# Patient Record
Sex: Male | Born: 1980 | Race: White | Hispanic: No | Marital: Single | Smoking: Current every day smoker
Health system: Southern US, Community
[De-identification: ages and names within clinical notes are randomized; demographics above are authoritative.]

---

## 2008-10-06 ENCOUNTER — Emergency Department: Payer: Self-pay | Admitting: Emergency Medicine

## 2009-08-28 ENCOUNTER — Emergency Department: Payer: Self-pay | Admitting: Emergency Medicine

## 2014-08-20 ENCOUNTER — Emergency Department: Payer: Self-pay | Admitting: Emergency Medicine

## 2015-11-07 SURGERY — LEFT HEART CATH
Anesthesia: Choice

## 2016-10-22 ENCOUNTER — Encounter: Payer: Self-pay | Admitting: Emergency Medicine

## 2016-10-22 ENCOUNTER — Emergency Department
Admission: EM | Admit: 2016-10-22 | Discharge: 2016-10-22 | Disposition: A | Discharge status: Home / Self Care | Payer: Self-pay | Attending: Emergency Medicine | Admitting: Emergency Medicine

## 2016-10-22 DIAGNOSIS — Y92009 Unspecified place in unspecified non-institutional (private) residence as the place of occurrence of the external cause: Secondary | ICD-10-CM | POA: Insufficient documentation

## 2016-10-22 DIAGNOSIS — F17200 Nicotine dependence, unspecified, uncomplicated: Secondary | ICD-10-CM | POA: Insufficient documentation

## 2016-10-22 DIAGNOSIS — T22211A Burn of second degree of right forearm, initial encounter: Secondary | ICD-10-CM | POA: Insufficient documentation

## 2016-10-22 DIAGNOSIS — Y93G3 Activity, cooking and baking: Secondary | ICD-10-CM | POA: Insufficient documentation

## 2016-10-22 DIAGNOSIS — Y999 Unspecified external cause status: Secondary | ICD-10-CM | POA: Insufficient documentation

## 2016-10-22 DIAGNOSIS — X101XXA Contact with hot food, initial encounter: Secondary | ICD-10-CM | POA: Insufficient documentation

## 2016-10-22 DIAGNOSIS — T22111A Burn of first degree of right forearm, initial encounter: Secondary | ICD-10-CM

## 2016-10-22 MED ORDER — HYDROCODONE-ACETAMINOPHEN 5-325 MG PO TABS: 1.0000 | ORAL_TABLET | ORAL | 0 refills | Status: DC | PRN

## 2016-10-22 MED ORDER — IBUPROFEN 600 MG PO TABS: 600.0000 mg | ORAL_TABLET | Freq: Three times a day (TID) | ORAL | 0 refills | Status: AC | PRN

## 2016-10-22 MED ORDER — IBUPROFEN 600 MG PO TABS
600.0000 mg | ORAL_TABLET | Freq: Once | ORAL | Status: AC
  Administered 2016-10-22: 600 mg via ORAL
  Filled 2016-10-22: qty 1

## 2016-10-22 MED ORDER — HYDROCODONE-ACETAMINOPHEN 5-325 MG PO TABS
1.0000 | ORAL_TABLET | Freq: Once | ORAL | Status: AC
  Administered 2016-10-22: 1 via ORAL
  Filled 2016-10-22: qty 1

## 2016-10-22 MED ORDER — SILVER SULFADIAZINE 1 % EX CREA: TOPICAL_CREAM | CUTANEOUS | 1 refills | Status: AC

## 2016-10-22 MED ORDER — SILVER SULFADIAZINE 1 % EX CREA
TOPICAL_CREAM | Freq: Every day | CUTANEOUS | Status: DC
  Administered 2016-10-22: 13:00:00 via TOPICAL
  Filled 2016-10-22: qty 85

## 2016-10-22 NOTE — ED Provider Notes (Signed)
[facility] Emergency Department Provider Note   ____________________________________________   First MD Initiated Contact with Patient 10/22/16 1259     (approximate)  I have reviewed the triage vital signs and the nursing notes.   HISTORY  Chief Complaint Burn   HPI Ryan Curtis is a 35 y.o. male is here with complaint of burn to his right forehand while he was cooking french fries in a "[employer]" at home. Patient states he spilled the grease on his right forearm. He has not taken any over-the-counter medication prior to his arrival. Patient states that his tetanus is up-to-date. Burn does not involve his hand. Currently he rates his pain as 10 over 10.    History reviewed. No pertinent past medical history.  There are no active problems to display for this patient.   History reviewed. No pertinent surgical history.  Prior to Admission medications   Medication Sig Start Date End Date Taking? Authorizing Provider  HYDROcodone-acetaminophen (NORCO/VICODIN) 5-325 MG tablet Take 1 tablet by mouth every 4 (four) hours as needed for moderate pain. 10/22/16   Tommi Rumpshonda L Laisha Rau, PA-C  ibuprofen (ADVIL,MOTRIN) 600 MG tablet Take 1 tablet (600 mg total) by mouth every 8 (eight) hours as needed. 10/22/16   Tommi Rumpshonda L Lyden Redner, PA-C  silver sulfADIAZINE (SILVADENE) 1 % cream Apply to affected area daily 10/22/16 10/22/17  Tommi Rumpshonda L Jaylyn Booher, PA-C    Allergies Penicillins  History reviewed. No pertinent family history.  Social History Social History  Substance Use Topics  . Smoking status: Current Every Day Smoker  . Smokeless tobacco: Never Used  . Alcohol use No    Review of Systems Constitutional: No fever/chills Eyes: No visual changes. Cardiovascular: Denies chest pain. Respiratory: Denies shortness of breath. Gastrointestinal:   No nausea, no vomiting.  Musculoskeletal: Negative for back pain. Skin: Grease burn to right forearm. Neurological:  Negative for headaches, focal weakness or numbness.  10-point ROS otherwise negative.  ____________________________________________   PHYSICAL EXAM:  VITAL SIGNS: ED Triage Vitals  Enc Vitals Group     BP 10/22/16 1252 140/75     Pulse Rate 10/22/16 1251 (!) 107     Resp 10/22/16 1251 18     Temp 10/22/16 1251 98.7 F (37.1 C)     Temp Source 10/22/16 1251 Oral     SpO2 10/22/16 1251 98 %     Weight 10/22/16 1251 215 lb (97.5 kg)     Height 10/22/16 1251 5\' 6"  (1.676 m)     Head Circumference --      Peak Flow --      Pain Score 10/22/16 1251 10     Pain Loc --      Pain Edu? --      Excl. in GC? --     Constitutional: Alert and oriented. Well appearing and in no acute distress. Eyes: Conjunctivae are normal. PERRL. EOMI. Head: Atraumatic. Nose: No congestion/rhinnorhea. Neck: No stridor.   Cardiovascular: Normal rate, regular rhythm. Grossly normal heart sounds.  Good peripheral circulation. Respiratory: Normal respiratory effort.  No retractions. Lungs CTAB. Musculoskeletal:Moves upper and lower extremities without any difficulty. Patient had normal gait to the exam room and was able to move without assistance. Neurologic:  Normal speech and language. No gross focal neurologic deficits are appreciated. No gait instability. Skin:  Skin is warm, dry. Dorsum of the right forearm there is direct first shaped burn areas consistent with a splash type pattern. Area is not circumferential. There is a  combination of first and second-degree burns present. Some open blisters are present. There is no involvement of the hand both dorsum and volar surface. Psychiatric: Mood and affect are normal. Speech and behavior are normal.  ____________________________________________   LABS (all labs ordered are listed, but only abnormal results are displayed)  Labs Reviewed - No data to display  PROCEDURES  Procedure(s) performed: None  Procedures  Critical Care performed:  No  ____________________________________________   INITIAL IMPRESSION / ASSESSMENT AND PLAN / ED COURSE  Pertinent labs & imaging results that were available during my care of the patient were reviewed by me and considered in my medical decision making (see chart for details).    Clinical Course    Patient was given ibuprofen and Norco while in the emergency room for pain. Area was cleaned with normal saline and a Silvadene burn dressing was applied. Patient was also given a prescription for Silvadene cream. He is to return [facility] the emergency room or [facility] in 2 days for recheck of his burn. He may return to the emergency room sooner if any signs of infection. Patient was discharged also with prescription for ibuprofen and for Norco.  ____________________________________________   FINAL CLINICAL IMPRESSION(S) / ED DIAGNOSES  Final diagnoses:  Burn of second degree of right forearm, initial encounter  Burn of first degree of right forearm, initial encounter      NEW MEDICATIONS STARTED DURING THIS VISIT:  Discharge Medication List as of 10/22/2016  1:42 PM    START taking these medications   Details  HYDROcodone-acetaminophen (NORCO/VICODIN) 5-325 MG tablet Take 1 tablet by mouth every 4 (four) hours as needed for moderate pain., Starting Tue 10/22/2016, Print    ibuprofen (ADVIL,MOTRIN) 600 MG tablet Take 1 tablet (600 mg total) by mouth every 8 (eight) hours as needed., Starting Tue 10/22/2016, Print         Note:  This document was prepared using Dragon voice recognition software and may include unintentional dictation errors.    Tommi RumpsRhonda L Elden Brucato, PA-C 10/22/16 1528    Jennye MoccasinBrian S Quigley, MD 10/22/16 1540

## 2016-10-22 NOTE — ED Triage Notes (Signed)
Pt spilled grease on right forearm today cooking french fries at home. First and mild second degree burn. Not circumferential. Does not involve hand.

## 2016-10-22 NOTE — ED Notes (Signed)
See triage note  First degree burns noted to right forearm

## 2016-10-22 NOTE — Discharge Instructions (Signed)
Take ibuprofen 600 mg 3 times a day with food. Norco as needed for severe pain. Do not drive while taking pain medication. Keep dressing clean and dry. You may need to elevate your arm to prevent swelling. Follow-up with urgent care or the emergency department for recheck after 1 minute at least 2 days and sooner if any signs of infection such as fever, increased pain, or pus.

## 2018-01-09 ENCOUNTER — Emergency Department
Admission: EM | Admit: 2018-01-09 | Discharge: 2018-01-09 | Disposition: A | Discharge status: Home / Self Care | Payer: Self-pay | Attending: Emergency Medicine | Admitting: Emergency Medicine

## 2018-01-09 ENCOUNTER — Other Ambulatory Visit: Payer: Self-pay

## 2018-01-09 DIAGNOSIS — K0889 Other specified disorders of teeth and supporting structures: Secondary | ICD-10-CM | POA: Insufficient documentation

## 2018-01-09 DIAGNOSIS — F17200 Nicotine dependence, unspecified, uncomplicated: Secondary | ICD-10-CM | POA: Insufficient documentation

## 2018-01-09 MED ORDER — HYDROCODONE-ACETAMINOPHEN 5-325 MG PO TABS: 1.0000 | ORAL_TABLET | Freq: Four times a day (QID) | ORAL | 0 refills | Status: AC | PRN

## 2018-01-09 MED ORDER — HYDROCODONE-ACETAMINOPHEN 5-325 MG PO TABS
1.0000 | ORAL_TABLET | Freq: Once | ORAL | Status: AC
  Administered 2018-01-09: 1 via ORAL
  Filled 2018-01-09: qty 1

## 2018-01-09 NOTE — Discharge Instructions (Signed)
OPTIONS FOR DENTAL FOLLOW UP CARE ° °[place] [facility] Health and Human Services - Local Safety Net Dental Clinics °http://www.ncdhhs.gov/dph/oralhealth/services/safetynetclinics.htm °  °[facility] (336-562-3123) ° °[facility] [place] ([phone]) ° °[facility] ([phone] ext [phone]) ° °[facility] ([phone]) ° °[facility] ([phone]) °This clinic caters to the indigent population and is on a lottery system. °Location: °[facility], [address], [place] °Clinic Hours: °Wednesdays from 6pm - 9pm, patients seen by a lottery system. °For dates, call or go to [email or web address].[email or web address]/[facility]/patients/Dental-[facility] °Services: °Cleanings, fillings and simple extractions. °Payment Options: °DENTAL WORK IS FREE OF CHARGE. Bring proof of income or support. °Best way to get seen: °Arrive at 5:15 pm - this is a lottery, NOT first come/first serve, so arriving earlier will not increase your chances of being seen. °  °  °[facility] °[phone] °Select option 1 for emergencies °  °Location: °[facility], [address], [place] °Clinic Hours: °No walk-ins accepted - call the day before to schedule an appointment. °Check in times are 9:30 am and 1:30 pm. °Services: °Simple extractions, temporary fillings, pulpectomy/pulp debridement, uncomplicated abscess drainage. °Payment Options: °PAYMENT IS DUE AT THE TIME OF SERVICE.  Fee is usually $100-200, additional surgical procedures (e.g. abscess drainage) may be extra. °Cash, checks, Visa/MasterCard accepted.  Can file Medicaid if patient is covered for dental - patient should call case worker to check. °No discount for [facility] patients. °Best way to get seen: °MUST call the day before and get onto the schedule. Can usually be seen the next 1-2 days. No walk-ins accepted. °  °  °[facility] °[phone] °   °Location: °[facility], [address], [place] °Clinic Hours: °M, W, Th, F 8am or 1:30pm, Tues 9a or 1:30 - first come/first served. °Services: °Simple extractions, temporary fillings, uncomplicated abscess drainage.  You do not need to be an [place] resident. °Payment Options: °PAYMENT IS DUE AT THE TIME OF SERVICE. °Dental insurance, otherwise sliding scale - bring proof of income or support. °Depending on income and treatment needed, cost is usually $50-200. °Best way to get seen: °Arrive early as it is first come/first served. °  °  °[facility] °[phone] °  °Location: °[address] °Clinic Hours: °Mon-Thu 8a-5p °Services: °Most basic dental services including extractions and fillings. °Payment Options: °PAYMENT IS DUE AT THE TIME OF SERVICE. °Sliding scale, up to 50% off - bring proof if income or support. °Medicaid with dental option accepted. °Best way to get seen: °Call to schedule an appointment, can usually be seen within 2 weeks OR they will try to see walk-ins - show up at 8a or 2p (you may have to wait). °  °  °[facility] °[phone] °[place] RESIDENTS ONLY °  °Location: °[facility], [address], [place], [place] [address] °Clinic Hours: By appointment only. °Monday - Thursday 8am-5pm, Friday 8am-12pm °Services: Cleanings, fillings, extractions. °Payment Options: °PAYMENT IS DUE AT THE TIME OF SERVICE. °Cash, Visa or MasterCard. Sliding scale - $30 minimum per service. °Best way to get seen: °Come in to office, complete packet and make an appointment - need proof of income °or support monies for each household member and proof of [place] residence. °Usually takes about a month to get in. °  °  °[facility] °[phone] °  °Location: °[address]   [place] °Clinic Hours: Walk-in Urgent Care Dental Services are offered Monday-Friday  mornings only. °The numbers of emergencies accepted daily is limited to the number of °providers available. °Maximum 15 - Mondays, Wednesdays & Thursdays °Maximum 10 - Tuesdays & Fridays °Services: °You do not need to be a [place] resident to be seen for a dental emergency. °Emergencies are defined as pain, swelling, abnormal bleeding, or dental trauma. Walkins will receive x-rays if needed. °NOTE: Dental cleaning is not an emergency. °Payment Options: °PAYMENT IS DUE AT THE TIME OF SERVICE. °Minimum co-pay is $40.00 for uninsured patients. °Minimum co-pay is $3.00 for Medicaid with dental coverage. °Dental Insurance is accepted and must be presented at time of visit. °Medicare does not cover dental. °Forms of payment: Cash, credit card, checks. °Best way to get seen: °If not previously registered with the clinic, walk-in dental registration begins at 7:15 am and is on a first come/first serve basis. °If previously registered with the clinic, call to make an appointment. °  °  °[facility] °[phone] °[place] RESIDENTS ONLY °  °Location: °[address], [place], [place] °Clinic Hours: °Mon-Thu 10a-2p °Services: Extractions only! °Payment Options: °FREE (donations accepted) - bring proof of income or support °Best way to get seen: °Call and schedule an appointment OR come at 8am on the 1st Monday of every month (except for holidays) when it is first come/first served. °  °  °[facility] °[phone] °  °Location: °[address], [place] °Clinic Hours: °Friday mornings °Services, Payment Options, Best way to get seen: °Call for info °

## 2018-01-09 NOTE — ED Triage Notes (Signed)
Pt comes from home with c/o dental pain. Pt states he is having left sided tooth pain. Pt states he has taken medication and used orajel but no relief.

## 2018-01-09 NOTE — ED Notes (Signed)
Pt c/o left sided dental pain x2 days; unrelieved by medications at home such as Orajel and advil.

## 2018-01-09 NOTE — ED Provider Notes (Signed)
[facility] Emergency Department Provider Note  ____________________________________________  Time seen: Approximately 3:42 PM  I have reviewed the triage vital signs and the nursing notes.   HISTORY  Chief Complaint Dental Pain    HPI Ryan Curtis is a 37 y.o. male presenting to the emergency department with inferior 19 pain dental caries.  Patient reports that his dentist is not in the office today and will be back on Monday.  The patient rates his pain at 10 out of 10 in intensity and reports that pain is influencing activities of daily living.  He denies fever, chills, nausea and vomiting.   History reviewed. No pertinent past medical history.  There are no active problems to display for this patient.   History reviewed. No pertinent surgical history.  Prior to Admission medications   Medication Sig Start Date End Date Taking? Authorizing Provider  HYDROcodone-acetaminophen (NORCO) 5-325 MG tablet Take 1 tablet by mouth every 6 (six) hours as needed for up to 2 days for moderate pain. 01/09/18 01/11/18  Orvil Feil, PA-C  ibuprofen (ADVIL,MOTRIN) 600 MG tablet Take 1 tablet (600 mg total) by mouth every 8 (eight) hours as needed. 10/22/16   Tommi Rumps, PA-C    Allergies Penicillins  No family history on file.  Social History Social History   Tobacco Use  . Smoking status: Current Every Day Smoker  . Smokeless tobacco: Never Used  Substance Use Topics  . Alcohol use: No  . Drug use: No     Review of Systems  Constitutional: No fever/chills Eyes: No visual changes. No discharge ENT: Patient has dental pain.  Cardiovascular: no chest pain. Respiratory: no cough. No SOB. Musculoskeletal: Negative for musculoskeletal pain. Skin: Negative for rash, abrasions, lacerations, ecchymosis.  ____________________________________________   PHYSICAL EXAM:  VITAL SIGNS: ED Triage Vitals  Enc Vitals Group     BP 01/09/18 1346 (!)  159/91     Pulse Rate 01/09/18 1346 82     Resp 01/09/18 1346 18     Temp 01/09/18 1346 98.6 F (37 C)     Temp Source 01/09/18 1346 Oral     SpO2 01/09/18 1346 99 %     Weight 01/09/18 1347 208 lb (94.3 kg)     Height 01/09/18 1347 5\' 8"  (1.727 m)     Head Circumference --      Peak Flow --      Pain Score 01/09/18 1347 10     Pain Loc --      Pain Edu? --      Excl. in GC? --      Constitutional: Alert and oriented. Well appearing and in no acute distress. Eyes: Conjunctivae are normal. PERRL. EOMI. Head: Atraumatic. ENT:      Ears: TMs are pearly.       Nose: No congestion/rhinnorhea.      Mouth/Throat: Mucous membranes are moist.  Patient has dental caries of inferior 19. Neck: No stridor.  No cervical spine tenderness to palpation. Cardiovascular: Normal rate, regular rhythm. Normal S1 and S2.  Good peripheral circulation. Respiratory: Normal respiratory effort without tachypnea or retractions. Lungs CTAB. Good air entry to the bases with no decreased or absent breath sounds. Skin:  Skin is warm, dry and intact. No rash noted. Psychiatric: Mood and affect are normal. Speech and behavior are normal. Patient exhibits appropriate insight and judgement.   ____________________________________________   LABS (all labs ordered are listed, but only abnormal results are displayed)  Labs  Reviewed - No data to display ____________________________________________  EKG   ____________________________________________  RADIOLOGY   No results found.  ____________________________________________    PROCEDURES  Procedure(s) performed:    Procedures    Medications  HYDROcodone-acetaminophen (NORCO/VICODIN) 5-325 MG per tablet 1 tablet (not administered)     ____________________________________________   INITIAL IMPRESSION / ASSESSMENT AND PLAN / ED COURSE  Pertinent labs & imaging results that were available during my care of the patient were reviewed by me  and considered in my medical decision making (see chart for details).  Review of the  [place]RS was performed in accordance of the [facility] prior to dispensing any controlled drugs.  Assessment and plan Dental pain Patient presents to the emergency department with dental pain of the inferior 19.  Due to severity of pain and patient's [place], patient was given Norco in the emergency department and discharged with a 2-day course of Norco to get him through the weekend.  Patient was advised to make an appointment with a local dentist as soon as possible.    ____________________________________________  FINAL CLINICAL IMPRESSION(S) / ED DIAGNOSES  Final diagnoses:  Pain, dental      NEW MEDICATIONS STARTED DURING THIS VISIT:  ED Discharge Orders        Ordered    HYDROcodone-acetaminophen (NORCO) 5-325 MG tablet  Every 6 hours PRN     01/09/18 1537          This chart was dictated using voice recognition software/Dragon. Despite best efforts to proofread, errors can occur which can change the meaning. Any change was purely unintentional.    Orvil FeilWoods, Lenzie Montesano M, PA-C 01/09/18 1550    Minna AntisPaduchowski, Kevin, MD 01/09/18 2206

## 2018-05-13 ENCOUNTER — Emergency Department
Admission: EM | Admit: 2018-05-13 | Discharge: 2018-05-13 | Disposition: A | Discharge status: Home / Self Care | Payer: Self-pay | Attending: Emergency Medicine | Admitting: Emergency Medicine

## 2018-05-13 ENCOUNTER — Emergency Department: Payer: Self-pay

## 2018-05-13 ENCOUNTER — Encounter: Payer: Self-pay | Admitting: Emergency Medicine

## 2018-05-13 DIAGNOSIS — S2232XA Fracture of one rib, left side, initial encounter for closed fracture: Secondary | ICD-10-CM | POA: Insufficient documentation

## 2018-05-13 DIAGNOSIS — Y9389 Activity, other specified: Secondary | ICD-10-CM | POA: Insufficient documentation

## 2018-05-13 DIAGNOSIS — F17200 Nicotine dependence, unspecified, uncomplicated: Secondary | ICD-10-CM | POA: Insufficient documentation

## 2018-05-13 DIAGNOSIS — W19XXXA Unspecified fall, initial encounter: Secondary | ICD-10-CM

## 2018-05-13 DIAGNOSIS — Y92009 Unspecified place in unspecified non-institutional (private) residence as the place of occurrence of the external cause: Secondary | ICD-10-CM | POA: Insufficient documentation

## 2018-05-13 DIAGNOSIS — Y999 Unspecified external cause status: Secondary | ICD-10-CM | POA: Insufficient documentation

## 2018-05-13 DIAGNOSIS — W1789XA Other fall from one level to another, initial encounter: Secondary | ICD-10-CM | POA: Insufficient documentation

## 2018-05-13 DIAGNOSIS — R0781 Pleurodynia: Secondary | ICD-10-CM

## 2018-05-13 MED ORDER — MELOXICAM 15 MG PO TABS: 15.0000 mg | ORAL_TABLET | Freq: Every day | ORAL | 1 refills | Status: AC

## 2018-05-13 MED ORDER — OXYCODONE-ACETAMINOPHEN 5-325 MG PO TABS
1.0000 | ORAL_TABLET | Freq: Once | ORAL | Status: AC
  Administered 2018-05-13: 1 via ORAL
  Filled 2018-05-13: qty 1

## 2018-05-13 MED ORDER — OXYCODONE-ACETAMINOPHEN 5-325 MG PO TABS: 1.0000 | ORAL_TABLET | Freq: Three times a day (TID) | ORAL | 0 refills | Status: AC | PRN

## 2018-05-13 MED ORDER — KETOROLAC TROMETHAMINE 30 MG/ML IJ SOLN
30.0000 mg | Freq: Once | INTRAMUSCULAR | Status: AC
  Administered 2018-05-13: 30 mg via INTRAMUSCULAR
  Filled 2018-05-13: qty 1

## 2018-05-13 NOTE — ED Notes (Signed)
Pt c/o left sided back pain after falling 684ft off a ladder. Pt states he was trying to get a ball from the gutter an slipped off the ladder. Pt denies hitting his head or any LOC.

## 2018-05-13 NOTE — ED Notes (Signed)

## 2018-05-13 NOTE — ED Notes (Signed)
Patient transported to XR. 

## 2018-05-13 NOTE — ED Provider Notes (Signed)
[facility] Emergency Department Provider Note  ____________________________________________  Time seen: Approximately 6:38 PM  I have reviewed the triage vital signs and the nursing notes.   HISTORY  Chief Complaint Fall    HPI Ryan Curtis is a 37 y.o. male presents to the emergency department after falling approximately 4 to 5 feet while trying to get a toy ball from the roof of the house.  Patient is reporting upper and lower back pain.  He currently rates his pain at 10 out of 10 in intensity.  Patient has had some mild tingling of the bilateral feet since incident occurred.  Patient did not hit his head or lose consciousness.  He is denying weakness and has been able to ambulate.  He denies shortness of breath, chest tightness and chest pain.   History reviewed. No pertinent past medical history.  There are no active problems to display for this patient.   History reviewed. No pertinent surgical history.  Prior to Admission medications   Medication Sig Start Date End Date Taking? Authorizing Provider  ibuprofen (ADVIL,MOTRIN) 600 MG tablet Take 1 tablet (600 mg total) by mouth every 8 (eight) hours as needed. 10/22/16   Tommi Rumps, PA-C  meloxicam (MOBIC) 15 MG tablet Take 1 tablet (15 mg total) by mouth daily for 7 days. 05/13/18 05/20/18  Orvil Feil, PA-C  oxyCODONE-acetaminophen (PERCOCET/ROXICET) 5-325 MG tablet Take 1 tablet by mouth every 8 (eight) hours as needed for up to 3 days for severe pain. 05/13/18 05/16/18  Orvil Feil, PA-C    Allergies Penicillins  No family history on file.  Social History Social History   Tobacco Use  . Smoking status: Current Every Day Smoker  . Smokeless tobacco: Never Used  Substance Use Topics  . Alcohol use: No  . Drug use: No     Review of Systems  Constitutional: No fever/chills Eyes: No visual changes. No discharge ENT: No upper respiratory complaints. Cardiovascular: no chest  pain. Respiratory: no cough. No SOB. Gastrointestinal: No abdominal pain.  No nausea, no vomiting.  No diarrhea.  No constipation. Genitourinary: Negative for dysuria. No hematuria Musculoskeletal: Patient has thoracic and lumbar back pain.  Skin: Negative for rash, abrasions, lacerations, ecchymosis. Neurological: Negative for headaches, focal weakness or numbness.  ____________________________________________   PHYSICAL EXAM:  VITAL SIGNS: ED Triage Vitals [05/13/18 1757]  Enc Vitals Group     BP (!) 157/81     Pulse Rate (!) 109     Resp 10     Temp 98.1 F (36.7 C)     Temp Source Oral     SpO2 100 %     Weight 195 lb (88.5 kg)     Height 5\' 3"  (1.6 m)     Head Circumference      Peak Flow      Pain Score 10     Pain Loc      Pain Edu?      Excl. in GC?      Constitutional: Alert and oriented. Well appearing and in no acute distress. Eyes: Conjunctivae are normal. PERRL. EOMI. Head: Atraumatic.  No palpable focal edema of the scalp. ENT:      Ears: TMs are pearly.  No discharge from the bilateral ears.  No ecchymosis was visualized behind the pinna bilaterally.      Nose: No congestion/rhinnorhea.      Mouth/Throat: Mucous membranes are moist.  Neck: No stridor.  No cervical spine tenderness  to palpation. Cardiovascular: Normal rate, regular rhythm. Normal S1 and S2.  Good peripheral circulation. Respiratory: Normal respiratory effort without tachypnea or retractions. Lungs CTAB. Good air entry to the bases with no decreased or absent breath sounds. Gastrointestinal: Bowel sounds 4 quadrants. Soft and nontender to palpation. No guarding or rigidity. No palpable masses. No distention. No CVA tenderness. Musculoskeletal: Full range of motion to all extremities. No gross deformities appreciated.  Patient has tenderness along the midline thoracic and lumbar spine.  Negative straight leg raise bilaterally. Neurologic:  Normal speech and language. No gross focal  neurologic deficits are appreciated.  Skin:  Skin is warm, dry and intact. No rash noted. Psychiatric: Mood and affect are normal. Speech and behavior are normal. Patient exhibits appropriate insight and judgement.   ____________________________________________   LABS (all labs ordered are listed, but only abnormal results are displayed)  Labs Reviewed - No data to display ____________________________________________  EKG   ____________________________________________  RADIOLOGY I personally viewed and evaluated these images as part of my medical decision making, as well as reviewing the written report by the radiologist.  Dg Chest 2 View  Result Date: 05/13/2018 CLINICAL DATA:  Mechanical fall, struck back. EXAM: CHEST - 2 VIEW COMPARISON:  None. FINDINGS: Cardiomediastinal silhouette is normal. No pleural effusions or focal consolidations. Trachea projects midline and there is no pneumothorax. Soft tissue planes and included osseous structures are non-suspicious. IMPRESSION: Normal. Electronically Signed   By: Awilda Metroourtnay  Bloomer M.D.   On: 05/13/2018 18:24   Dg Ribs Unilateral Left  Result Date: 05/13/2018 CLINICAL DATA:  Fall off ladder from 4 ft height. Left rib pain. Initial encounter. EXAM: LEFT RIBS - 2 VIEW COMPARISON:  None. FINDINGS: Probable mildly displaced fracture of the posteromedial left 12th rib. No other rib fractures or bone lesions identified. IMPRESSION: Probable mildly displaced fracture of the left posteromedial 12th rib. Electronically Signed   By: Myles RosenthalJohn  Stahl M.D.   On: 05/13/2018 19:52   Dg Thoracic Spine 2 View  Result Date: 05/13/2018 CLINICAL DATA:  Fall from ladder.  Pain in left lower ribs. EXAM: THORACIC SPINE 2 VIEWS COMPARISON:  05/13/2018 FINDINGS: Mild curvature of the thoracic spine is convex towards the right. The vertebral body heights are well preserved. Normal appearance of the disc spaces. No fractures or dislocations. IMPRESSION: 1. No acute  findings. 2. Mild thoracic scoliosis. Electronically Signed   By: Signa Kellaylor  Stroud M.D.   On: 05/13/2018 19:48   Dg Lumbar Spine Complete  Result Date: 05/13/2018 CLINICAL DATA:  Fall off ladder. EXAM: LUMBAR SPINE - COMPLETE 4+ VIEW COMPARISON:  None. FINDINGS: There is a anterolisthesis of L5 on S1 measuring 1.4 cm. Bilateral L5 pars defects are identified. The vertebral body heights are well preserved. No compression fractures identified. IMPRESSION: 1. No acute compression deformities. 2. 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 Electronically Signed   By: Signa Kellaylor  Stroud M.D.   On: 05/13/2018 19:55   Dg Clavicle Left  Result Date: 05/13/2018 CLINICAL DATA:  Fall off ladder from 4 ft height. Left clavicle pain. Initial encounter. EXAM: LEFT CLAVICLE - 2+ VIEWS COMPARISON:  None. FINDINGS: There is no evidence of fracture or other focal bone lesions. Soft tissues are unremarkable. IMPRESSION: Negative. Electronically Signed   By: Myles RosenthalJohn  Stahl M.D.   On: 05/13/2018 19:51    ____________________________________________    PROCEDURES  Procedure(s) performed:    Procedures    Medications  ketorolac (TORADOL) 30 MG/ML injection 30 mg (30 mg Intramuscular  Given 05/13/18 1840)  oxyCODONE-acetaminophen (PERCOCET/ROXICET) 5-325 MG per tablet 1 tablet (1 tablet Oral Given 05/13/18 1840)     ____________________________________________   INITIAL IMPRESSION / ASSESSMENT AND PLAN / ED COURSE  Pertinent labs & imaging results that were available during my care of the patient were reviewed by me and considered in my medical decision making (see chart for details).  Review of the [place] CSRS was performed in accordance of the [facility] prior to dispensing any controlled drugs.      Assessment and Plan: Fall: Patient presents to the emergency department after falling 4 to 5 feet while trying to extract a ball from a roof.  DG left ribs reveal a possible mildly displaced 12th rib  fracture.  X-ray examination of the thoracic and lumbar spine as well as left clavicle were reassuring without acute bony abnormality.  Patient was given Toradol and Roxicet in the emergency department.  He was discharged with a brief course of Roxicet and meloxicam and advised to follow-up with primary care as needed.  All patient questions were answered.     ____________________________________________  FINAL CLINICAL IMPRESSION(S) / ED DIAGNOSES  Final diagnoses:  Fall, initial encounter  Closed fracture of one rib of left side, initial encounter      NEW MEDICATIONS STARTED DURING THIS VISIT:  ED Discharge Orders        Ordered    meloxicam (MOBIC) 15 MG tablet  Daily     05/13/18 2010    oxyCODONE-acetaminophen (PERCOCET/ROXICET) 5-325 MG tablet  Every 8 hours PRN     05/13/18 2010          This chart was dictated using voice recognition software/Dragon. Despite best efforts to proofread, errors can occur which can change the meaning. Any change was purely unintentional.    Gasper Lloyd 05/13/18 2127    Arnaldo Natal, MD 05/13/18 2245

## 2018-05-13 NOTE — ED Triage Notes (Signed)
Pt arrived after mechanical fall caused pt to hit his back. Pt denies hitting head or any loc.

## 2018-06-20 ENCOUNTER — Emergency Department
Admission: EM | Admit: 2018-06-20 | Discharge: 2018-06-20 | Disposition: A | Discharge status: Home / Self Care | Payer: Self-pay | Attending: Emergency Medicine | Admitting: Emergency Medicine

## 2018-06-20 ENCOUNTER — Other Ambulatory Visit: Payer: Self-pay

## 2018-06-20 DIAGNOSIS — R1110 Vomiting, unspecified: Secondary | ICD-10-CM | POA: Insufficient documentation

## 2018-06-20 DIAGNOSIS — J029 Acute pharyngitis, unspecified: Secondary | ICD-10-CM

## 2018-06-20 DIAGNOSIS — R05 Cough: Secondary | ICD-10-CM | POA: Insufficient documentation

## 2018-06-20 DIAGNOSIS — E860 Dehydration: Secondary | ICD-10-CM

## 2018-06-20 DIAGNOSIS — R0982 Postnasal drip: Secondary | ICD-10-CM

## 2018-06-20 DIAGNOSIS — F17200 Nicotine dependence, unspecified, uncomplicated: Secondary | ICD-10-CM | POA: Insufficient documentation

## 2018-06-20 LAB — URINALYSIS, ROUTINE W REFLEX MICROSCOPIC
Bilirubin Urine: NEGATIVE
Glucose, UA: NEGATIVE mg/dL
HGB URINE DIPSTICK: NEGATIVE
KETONES UR: NEGATIVE mg/dL
Leukocytes, UA: NEGATIVE
NITRITE: NEGATIVE
Protein, ur: NEGATIVE mg/dL
SPECIFIC GRAVITY, URINE: 1.019 (ref 1.005–1.030)
pH: 5 (ref 5.0–8.0)

## 2018-06-20 LAB — CBC WITH DIFFERENTIAL/PLATELET
Basophils Absolute: 0.1 10*3/uL (ref 0–0.1)
Basophils Relative: 1 %
EOS PCT: 1 %
Eosinophils Absolute: 0.1 10*3/uL (ref 0–0.7)
HEMATOCRIT: 41.5 % (ref 40.0–52.0)
Hemoglobin: 14.4 g/dL (ref 13.0–18.0)
LYMPHS PCT: 26 %
Lymphs Abs: 2.7 10*3/uL (ref 1.0–3.6)
MCH: 28 pg (ref 26.0–34.0)
MCHC: 34.7 g/dL (ref 32.0–36.0)
MCV: 80.8 fL (ref 80.0–100.0)
MONO ABS: 1.2 10*3/uL — AB (ref 0.2–1.0)
MONOS PCT: 12 %
NEUTROS ABS: 6.4 10*3/uL (ref 1.4–6.5)
Neutrophils Relative %: 62 %
PLATELETS: 196 10*3/uL (ref 150–440)
RBC: 5.14 MIL/uL (ref 4.40–5.90)
RDW: 13.9 % (ref 11.5–14.5)
WBC: 10.4 10*3/uL (ref 3.8–10.6)

## 2018-06-20 LAB — BASIC METABOLIC PANEL
Anion gap: 9 (ref 5–15)
BUN: 8 mg/dL (ref 6–20)
CALCIUM: 8.5 mg/dL — AB (ref 8.9–10.3)
CO2: 23 mmol/L (ref 22–32)
CREATININE: 0.82 mg/dL (ref 0.61–1.24)
Chloride: 108 mmol/L (ref 98–111)
GFR calc non Af Amer: >60 mL/min (ref >60)
Glucose, Bld: 110 mg/dL — ABNORMAL HIGH (ref 70–99)
Potassium: 3.8 mmol/L (ref 3.5–5.1)
SODIUM: 140 mmol/L (ref 135–145)

## 2018-06-20 LAB — GROUP A STREP BY PCR: GROUP A STREP BY PCR: NOT DETECTED

## 2018-06-20 MED ORDER — MAGIC MOUTHWASH W/LIDOCAINE: 5.0000 mL | Freq: Four times a day (QID) | ORAL | 0 refills | Status: AC

## 2018-06-20 MED ORDER — ONDANSETRON 4 MG PO TBDP: 4.0000 mg | ORAL_TABLET | Freq: Three times a day (TID) | ORAL | 0 refills | Status: AC | PRN

## 2018-06-20 MED ORDER — FLUTICASONE PROPIONATE 50 MCG/ACT NA SUSP: 1.0000 | Freq: Two times a day (BID) | NASAL | 0 refills | Status: AC

## 2018-06-20 NOTE — ED Triage Notes (Signed)
Patient reports sore throat for 2 weeks, today worse.  Also reports fever/chills, vomiting and dark urine.  States took ibuprofen approximately 4 hours prior to arrival.

## 2018-06-20 NOTE — ED Notes (Signed)
PA Jonathon up to stat desk to request UA on pt and vital signs-none entered while pt in triage; pt resting in WR; ambulated to triage area with steady gait, pt with ED tech Ian MalkinZach

## 2018-06-20 NOTE — ED Provider Notes (Signed)
[facility] Emergency Department Provider Note  ____________________________________________  Time seen: Approximately 10:46 PM  I have reviewed the triage vital signs and the nursing notes.   HISTORY  Chief Complaint Sore Throat and Emesis    HPI Ryan Curtis is a 37 y.o. male who presents the emergency department with multiple complaints.  Patient reports that approximately 2 weeks ago he had developed some mild sore throat and coughing.  Patient states that the symptoms had improved and then his wife came down with a viral illness.  The illness run its course with his wife and then he developed a sore throat again.  Patient reports over the last 2 days he has been outside and 97 degree plus weather laying concrete and cinderblock.  Patient reports that after this his symptoms have worsened.  Patient reports that he has had some chills but no fever, nasal congestion, sore throat, cough and sneezing, emesis.  Patient reports that he drinks a lot of water but does not replenish his electrolytes.  In addition to symptoms, patient reports that his urine was dark.  Patient denies any headache, visual changes, neck pain or stiffness, chest pain, shortness of breath, abdominal pain, diarrhea or constipation.  No dysuria, polyuria, hematuria.  Patient is not tried any medications for this complaint prior to arrival.    No past medical history on file.  There are no active problems to display for this patient.   No past surgical history on file.  Prior to Admission medications   Medication Sig Start Date End Date Taking? Authorizing Provider  fluticasone (FLONASE) 50 MCG/ACT nasal spray Place 1 spray into both nostrils 2 (two) times daily. 06/20/18   Reisa Coppola, Delorise Royals, PA-C  ibuprofen (ADVIL,MOTRIN) 600 MG tablet Take 1 tablet (600 mg total) by mouth every 8 (eight) hours as needed. 10/22/16   Tommi Rumps, PA-C  magic mouthwash w/lidocaine SOLN Take 5 mLs by  mouth 4 (four) times daily. 06/20/18   Nevae Pinnix, Delorise Royals, PA-C  ondansetron (ZOFRAN-ODT) 4 MG disintegrating tablet Take 1 tablet (4 mg total) by mouth every 8 (eight) hours as needed for nausea or vomiting. 06/20/18   Shakeria Robinette, Delorise Royals, PA-C    Allergies Penicillins  No family history on file.  Social History Social History   Tobacco Use  . Smoking status: Current Every Day Smoker  . Smokeless tobacco: Never Used  Substance Use Topics  . Alcohol use: No  . Drug use: No     Review of Systems  Constitutional: No fever but positive for chills Eyes: No visual changes. No discharge ENT: Nasal congestion, sore throat, sneezing. Cardiovascular: no chest pain. Respiratory: Positive cough. No SOB. Gastrointestinal: No abdominal pain.  Positive for nausea and vomiting.  No diarrhea.  No constipation. Genitourinary: Negative for dysuria. No hematuria.  Positive for darker urine. Musculoskeletal: Negative for musculoskeletal pain. Skin: Negative for rash, abrasions, lacerations, ecchymosis. Neurological: Negative for headaches, focal weakness or numbness. 10-point ROS otherwise negative.  ____________________________________________   PHYSICAL EXAM:  VITAL SIGNS: ED Triage Vitals  Enc Vitals Group     BP 06/20/18 2142 (!) 149/67     Pulse Rate 06/20/18 2142 71     Resp 06/20/18 2142 18     Temp 06/20/18 2142 99.1 F (37.3 C)     Temp Source 06/20/18 2142 Oral     SpO2 06/20/18 2142 99 %     Weight 06/20/18 2043 215 lb (97.5 kg)     Height 06/20/18  2043 5\' 9"  (1.753 m)     Head Circumference --      Peak Flow --      Pain Score 06/20/18 2043 9     Pain Loc --      Pain Edu? --      Excl. in GC? --      Constitutional: Alert and oriented. Well appearing and in no acute distress. Eyes: Conjunctivae are normal. PERRL. EOMI. Head: Atraumatic. ENT:      Ears: EACs and TMs unremarkable bilaterally.      Nose: Moderate congestion/rhinnorhea.  Minutes are boggy.       Mouth/Throat: Mucous membranes are moist.  Tonsils are edematous bilaterally and mildly erythematous.  No exudates.  Tonsils are equal bilaterally with no uvular deviation.  Solitary aphthous ulcer noted to the left tongue. Neck: No stridor.  Neck is supple full range of motion Hematological/Lymphatic/Immunilogical: Scattered, nontender, anterior cervical lymphadenopathy Cardiovascular: Normal rate, regular rhythm. Normal S1 and S2.  Good peripheral circulation. Respiratory: Normal respiratory effort without tachypnea or retractions. Lungs CTAB. Good air entry to the bases with no decreased or absent breath sounds. Gastrointestinal: Bowel sounds 4 quadrants. Soft and nontender to palpation. No guarding or rigidity. No palpable masses. No distention. No CVA tenderness Musculoskeletal: Full range of motion to all extremities. No gross deformities appreciated. Neurologic:  Normal speech and language. No gross focal neurologic deficits are appreciated.  Skin:  Skin is warm, dry and intact. No rash noted. Psychiatric: Mood and affect are normal. Speech and behavior are normal. Patient exhibits appropriate insight and judgement.   ____________________________________________   LABS (all labs ordered are listed, but only abnormal results are displayed)  Labs Reviewed  CBC WITH DIFFERENTIAL/PLATELET - Abnormal; Notable for the following components:      Result Value   Monocytes Absolute 1.2 (*)    All other components within normal limits  BASIC METABOLIC PANEL - Abnormal; Notable for the following components:   Glucose, Bld 110 (*)    Calcium 8.5 (*)    All other components within normal limits  URINALYSIS, ROUTINE W REFLEX MICROSCOPIC - Abnormal; Notable for the following components:   Color, Urine YELLOW (*)    APPearance CLEAR (*)    All other components within normal limits  GROUP A STREP BY PCR    ____________________________________________  EKG   ____________________________________________  RADIOLOGY   No results found.  ____________________________________________    PROCEDURES  Procedure(s) performed:    Procedures    Medications - No data to display   ____________________________________________   INITIAL IMPRESSION / ASSESSMENT AND PLAN / ED COURSE  Pertinent labs & imaging results that were available during my care of the patient were reviewed by me and considered in my medical decision making (see chart for details).  Review of the  CSRS was performed in accordance of the [facility] prior to dispensing any controlled drugs.      Patient's diagnosis is consistent with viral URI, mild dehydration.  Patient presents the emergency department with a complaint of sore throat, nasal congestion, cough, dark-colored urine.  Differential included strep, viral URI, peritonsillar abscess, bronchitis, pneumonia, rhabdo, poststreptococcal glomerulonephritis.  Patient was negative for strep.  Tonsils do not indicate any peritonsillar abscess.  Patient does have increased nasal congestion with postnasal drip.  I suspect that majority of symptoms are consistent with same.  Patient's wife recently had similar symptoms that was diagnosed as a virus.  I suspect that patient does have viral URI.  Patient  has been outside in the heat over the past couple of days and does have some symptoms of mild dehydration.  Mucous membranes are moist and no indication for IV fluids as patient is able to tolerate p.o fluids.  Flonase, Magic mouthwash to be prescribed as well as Zofran.  Patient is to drink plenty of fluids and replace his electrolytes over the next 2 days.  No indication for further work-up at this time.  Patient will follow primary care as needed. Patient is given ED precautions to return to the ED for any worsening or new  symptoms.     ____________________________________________  FINAL CLINICAL IMPRESSION(S) / ED DIAGNOSES  Final diagnoses:  Viral pharyngitis  Postnasal drip  Mild dehydration      NEW MEDICATIONS STARTED DURING THIS VISIT:  ED Discharge Orders        Ordered    fluticasone (FLONASE) 50 MCG/ACT nasal spray  2 times daily     06/20/18 2251    magic mouthwash w/lidocaine SOLN  4 times daily    Note to Pharmacy:  Dispense in a 1/1/1 ratio. Use lidocaine, diphenhydramine, prednisolone   06/20/18 2251    ondansetron (ZOFRAN-ODT) 4 MG disintegrating tablet  Every 8 hours PRN     06/20/18 2251          This chart was dictated using voice recognition software/Dragon. Despite best efforts to proofread, errors can occur which can change the meaning. Any change was purely unintentional.    Racheal PatchesCuthriell, Clarabel Marion D, PA-C 06/20/18 2252    Dionne BucySiadecki, Sebastian, MD 06/20/18 [phone]

## 2018-06-20 NOTE — ED Notes (Signed)
Pt given mask at stat desk-reports sore throat with sores present, ear aches, hot/cold chills, headache, N/V

## 2019-12-08 IMAGING — CR DG THORACIC SPINE 2V
3 series · 3 of 3 positions shown · non-contrast
Comparison: 05/13/2018

CLINICAL DATA: Fall from ladder.  Pain in left lower ribs.

EXAM:
THORACIC SPINE 2 VIEWS

[t-spine ap]
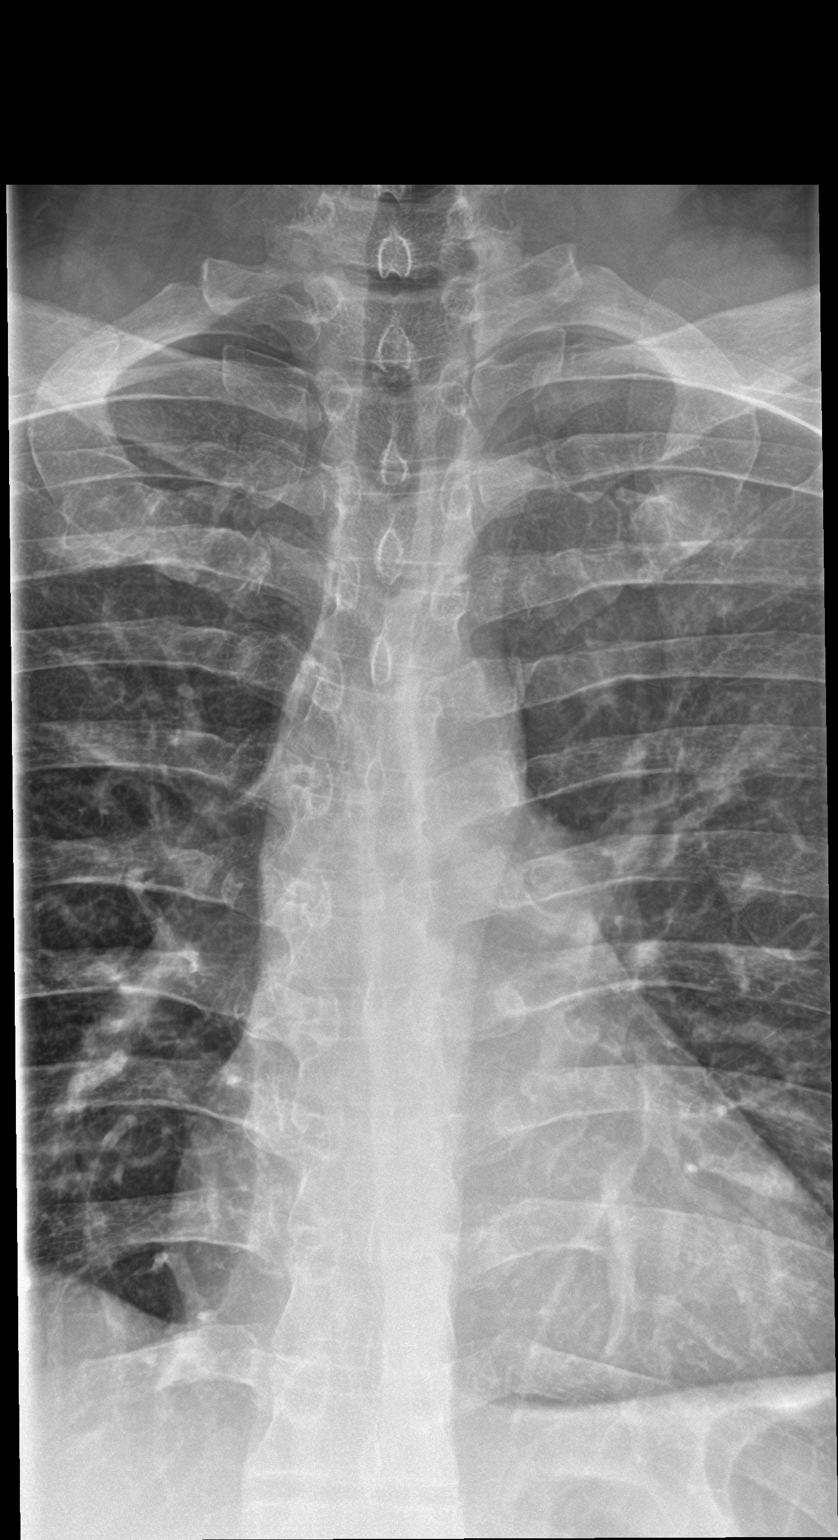

[t-spine lat]
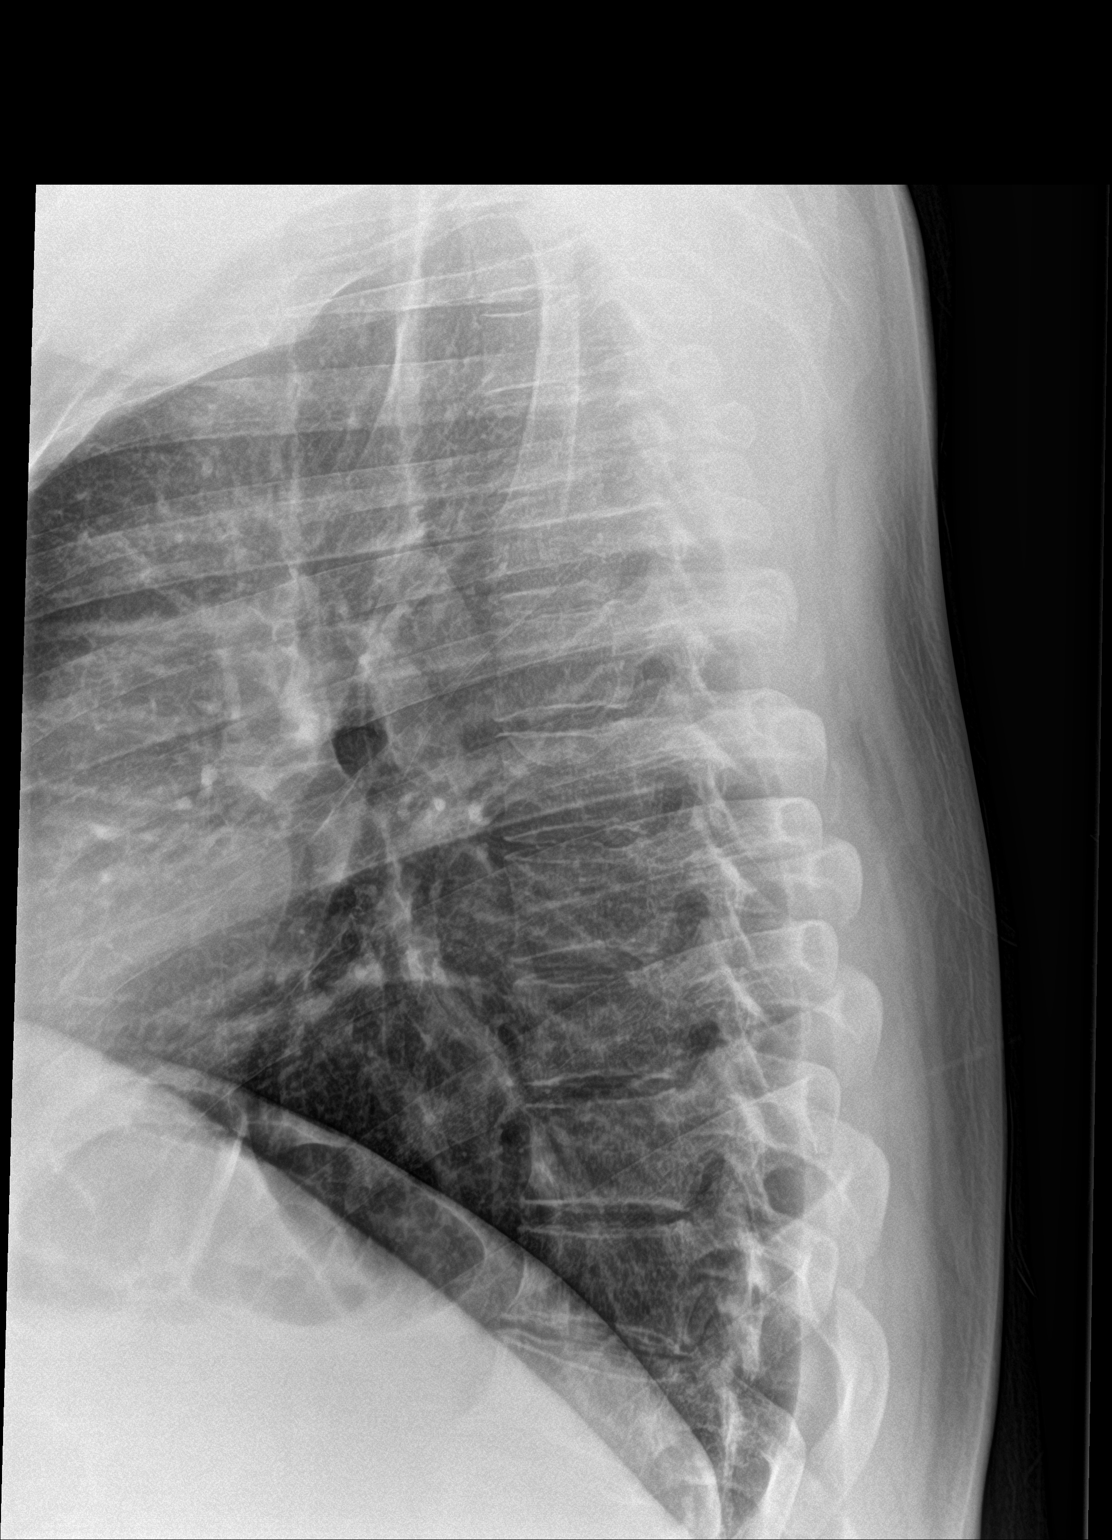

[t-spine swimmers]
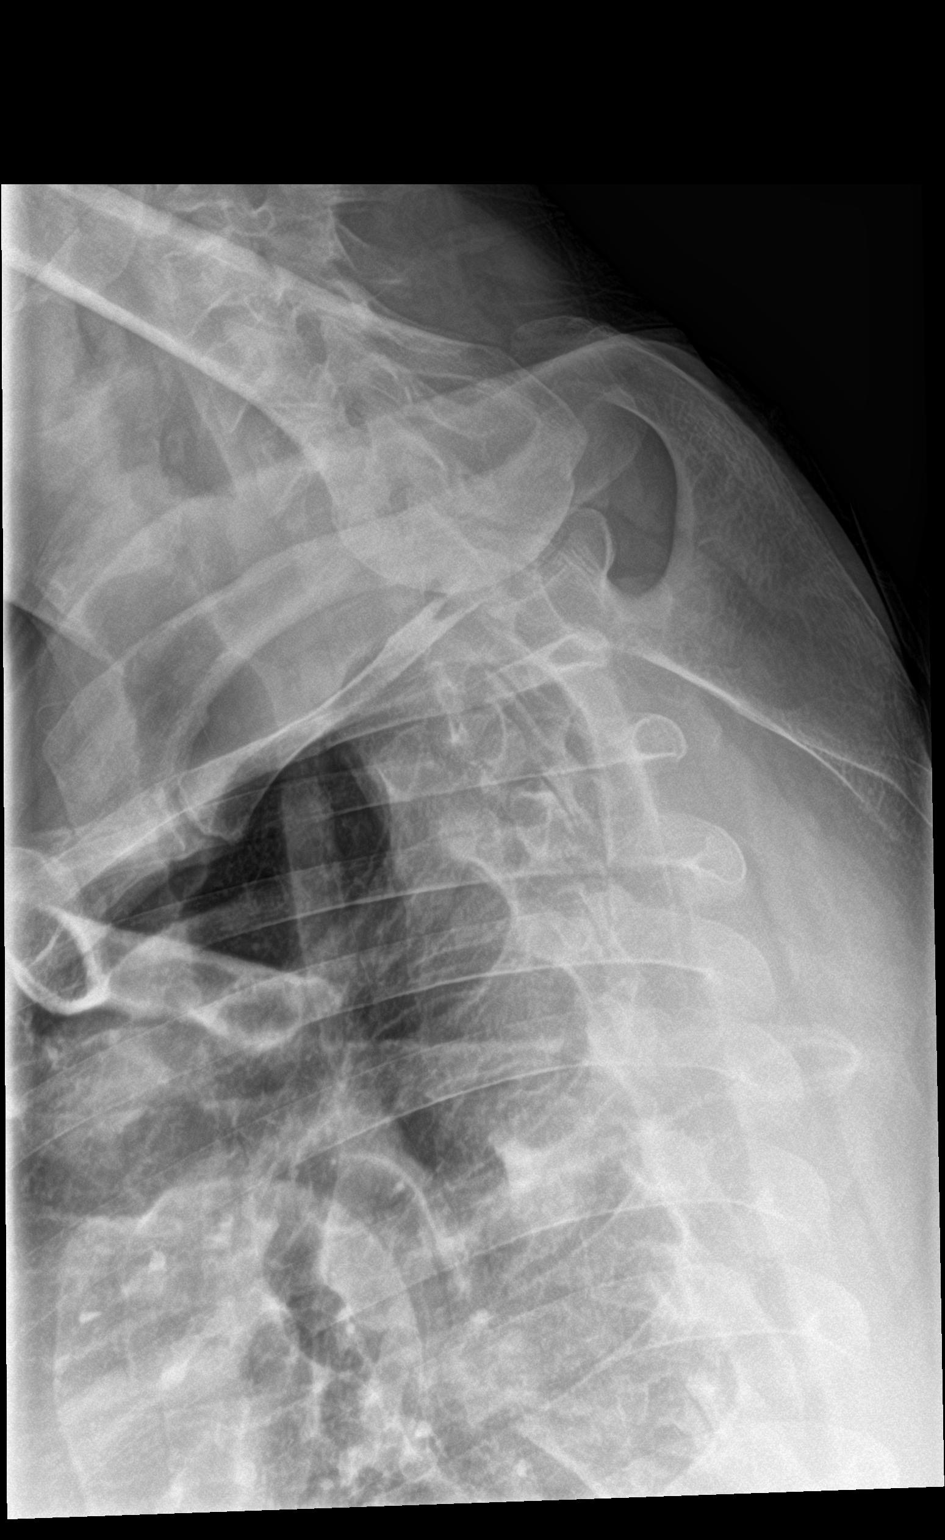

[3 of 3 positions shown; findings below may reference images not displayed]

FINDINGS: Mild curvature of the thoracic spine is convex towards the right.
The vertebral body heights are well preserved. Normal appearance of
the disc spaces. No fractures or dislocations.
IMPRESSION: 1. No acute findings.
2. Mild thoracic scoliosis.

## 2019-12-08 IMAGING — CR DG LUMBAR SPINE COMPLETE 4+V
5 series · 5 of 5 positions shown · non-contrast
Comparison: None.

CLINICAL DATA: Fall off ladder.

EXAM:
LUMBAR SPINE - COMPLETE 4+ VIEW

[l-spine ap]
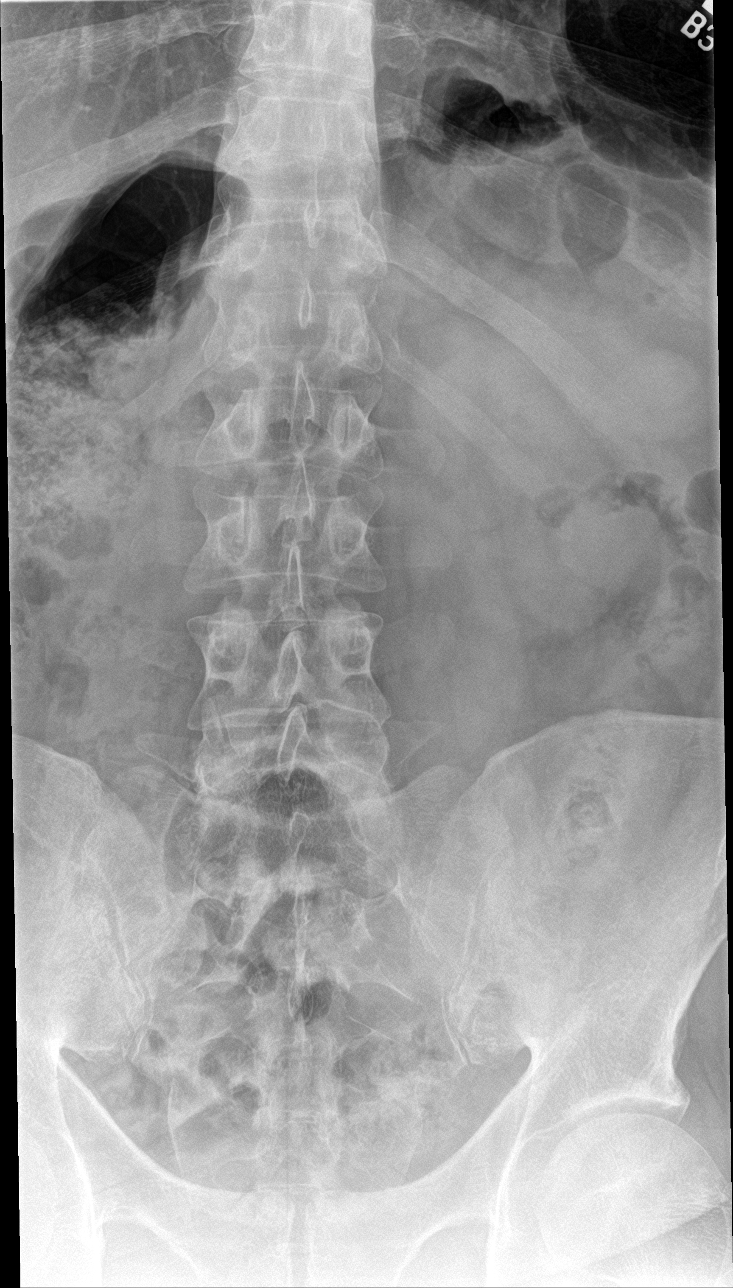

[l-spine obl (1 of 2)]
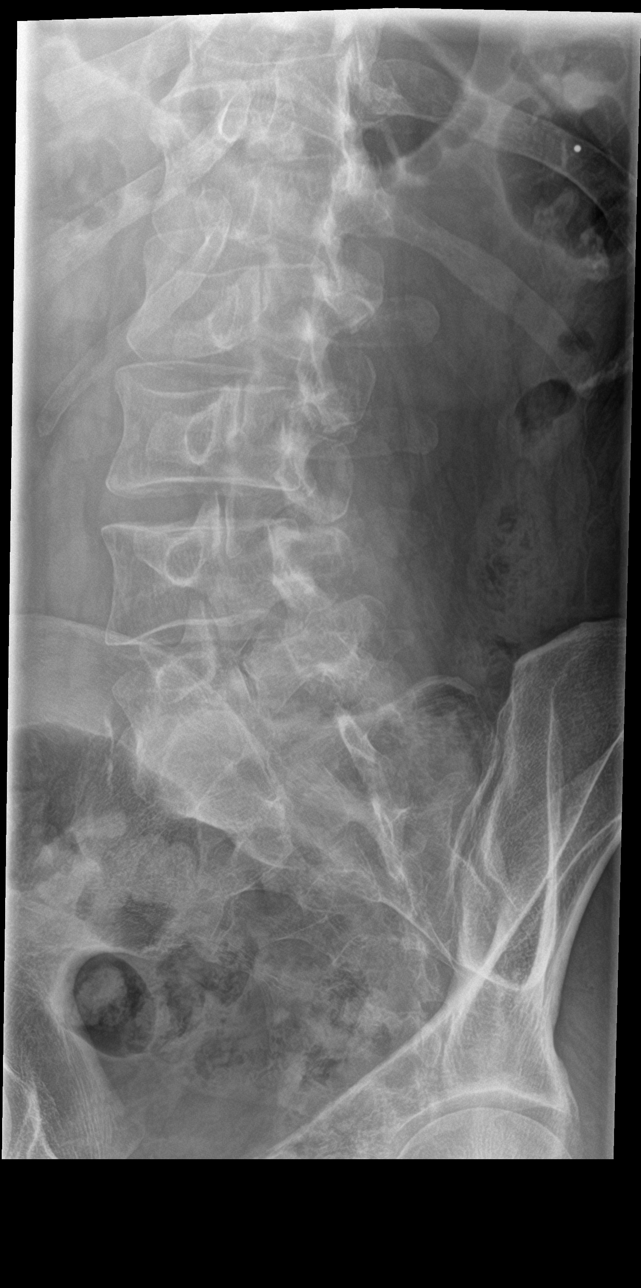

[l-spine obl (2 of 2)]
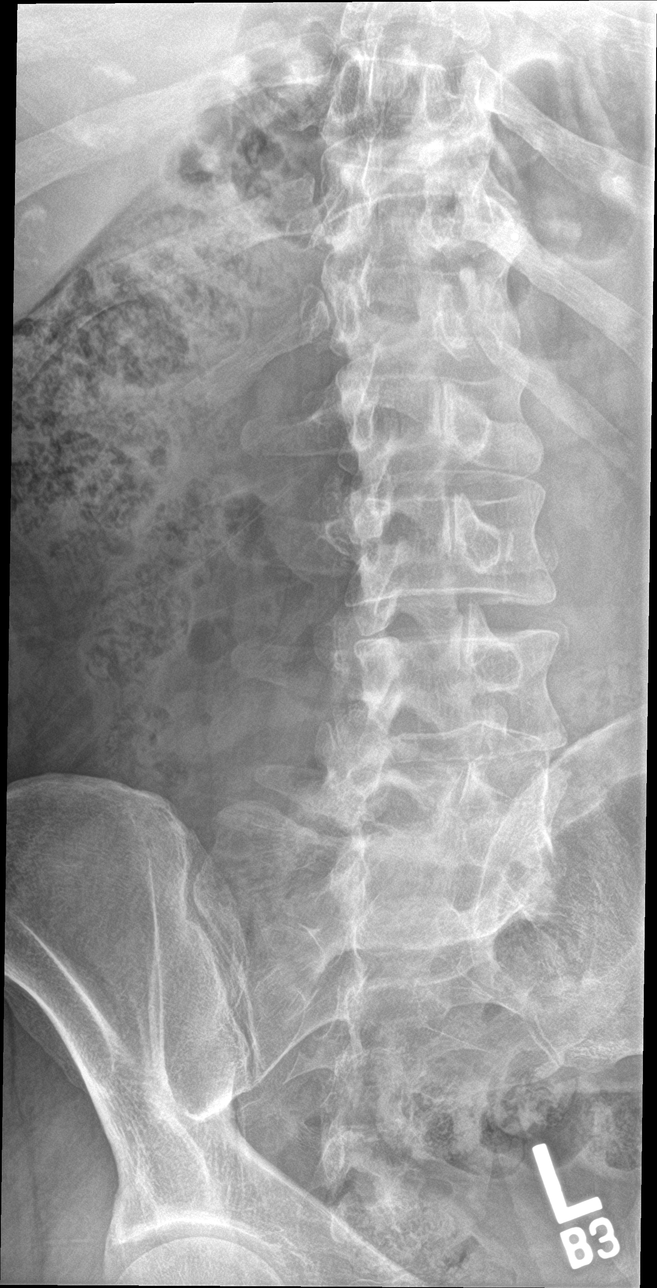

[l-spine lat]
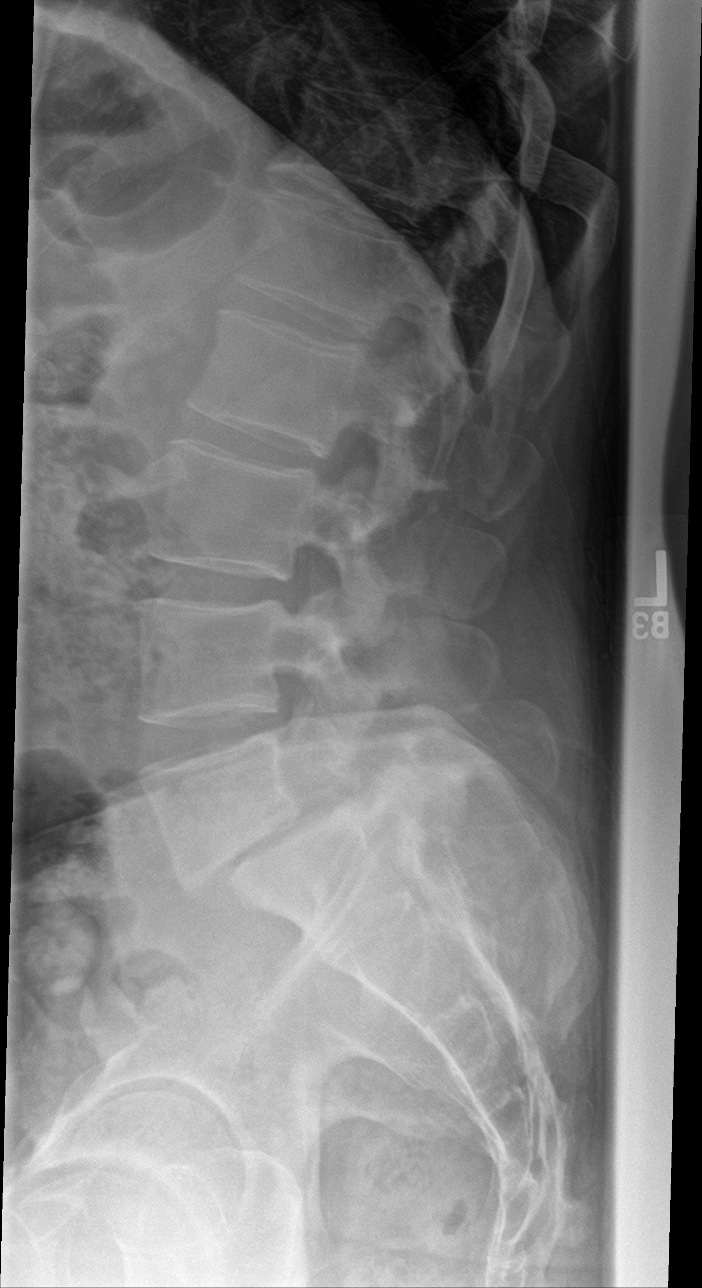

[l-spine spot]
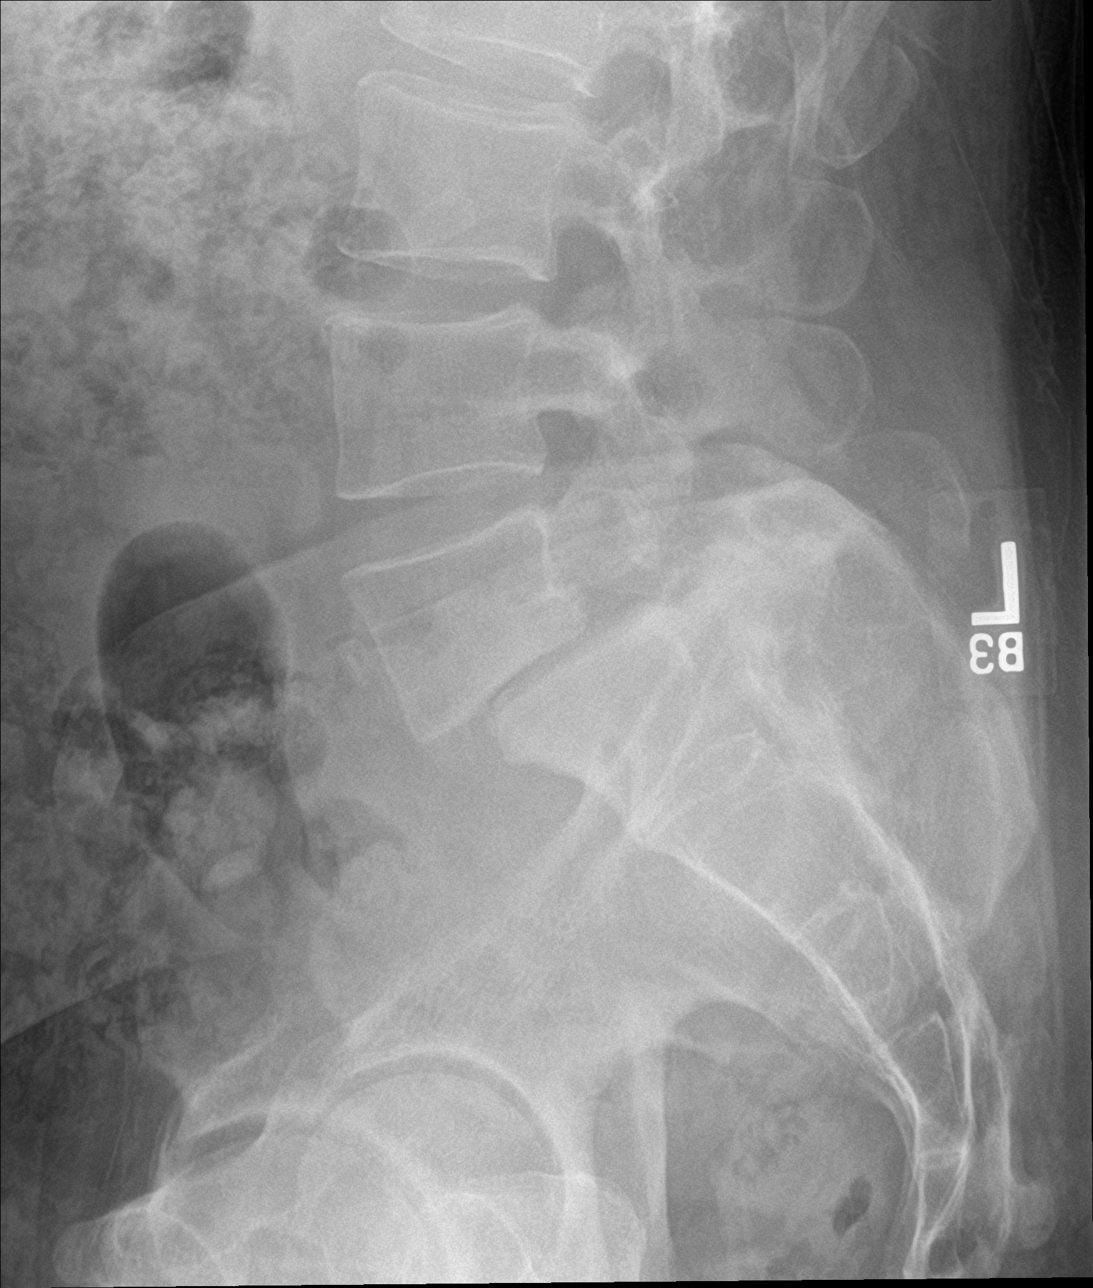

[5 of 5 positions shown; findings below may reference images not displayed]

FINDINGS: There is a anterolisthesis of L5 on S1 measuring 1.4 cm. Bilateral
L5 pars defects are identified. The vertebral body heights are well
preserved. No compression fractures identified.
IMPRESSION: 1. No acute compression deformities.
2. Anterolisthesis of L5 on S1 with bilateral L5 pars defects.

## 2022-09-29 ENCOUNTER — Encounter: Payer: Self-pay | Admitting: Emergency Medicine

## 2022-09-29 ENCOUNTER — Emergency Department
Admission: EM | Admit: 2022-09-29 | Discharge: 2022-09-29 | Disposition: A | Discharge status: Home / Self Care | Payer: Self-pay | Attending: Emergency Medicine | Admitting: Emergency Medicine

## 2022-09-29 ENCOUNTER — Emergency Department: Payer: Self-pay

## 2022-09-29 ENCOUNTER — Other Ambulatory Visit: Payer: Self-pay

## 2022-09-29 DIAGNOSIS — S60221A Contusion of right hand, initial encounter: Secondary | ICD-10-CM | POA: Insufficient documentation

## 2022-09-29 DIAGNOSIS — W278XXA Contact with other nonpowered hand tool, initial encounter: Secondary | ICD-10-CM | POA: Insufficient documentation

## 2022-09-29 NOTE — ED Triage Notes (Signed)
Pt reports 7 days was using a shovel and jammed his right hand. Pt reports shook it off but the pain continued and now the pain is radiating up his right arm to his elbow.

## 2022-09-29 NOTE — Discharge Instructions (Signed)
Rest, ice, elevate your hand.  You may take Tylenol/ibuprofen per package instructions to help with your pain.  You may also wear the splint at night to prevent flexion of your wrists.  Please return for any new, worsening, or change in symptoms or other concerns.  It was a pleasure caring for you today.

## 2022-09-29 NOTE — ED Provider Notes (Signed)
[facility] Provider Note    Event Date/Time   First MD Initiated Contact with Patient 09/29/22 1758     (approximate)   History   Hand Pain   HPI  Ryan Curtis is a 41 y.o. male who presents today for evaluation of hand pain.  Patient reports that 1 week ago he was shoveling when he hit a rock and the shovel handle jammed his palm.  He reports that he has continued to have pain ever since.  He is able to move his wrist and fingers normally.  He still has tenderness to palpation to his palm.  Denies numbness or tingling.  Denies weakness.  There are no problems to display for this patient.         Physical Exam   Triage Vital Signs: ED Triage Vitals  Enc Vitals Group     BP 09/29/22 1622 133/82     Pulse Rate 09/29/22 1622 60     Resp 09/29/22 1622 20     Temp 09/29/22 1622 98.5 F (36.9 C)     Temp Source 09/29/22 1622 Oral     SpO2 09/29/22 1622 95 %     Weight 09/29/22 1604 155 lb (70.3 kg)     Height 09/29/22 1604 5\' 10"  (1.778 m)     Head Circumference --      Peak Flow --      Pain Score 09/29/22 1604 10     Pain Loc --      Pain Edu? --      Excl. in GC? --     Most recent vital signs: Vitals:   09/29/22 1622  BP: 133/82  Pulse: 60  Resp: 20  Temp: 98.5 F (36.9 C)  SpO2: 95%    Physical Exam Vitals and nursing note reviewed.  Constitutional:      General: Awake and alert. No acute distress.    Appearance: Normal appearance. The patient is normal weight.  HENT:     Head: Normocephalic and atraumatic.     Mouth: Mucous membranes are moist.  Eyes:     General: PERRL. Normal EOMs        Right eye: No discharge.        Left eye: No discharge.     Conjunctiva/sclera: Conjunctivae normal.  Cardiovascular:     Rate and Rhythm: Normal rate and regular rhythm.     Pulses: Normal pulses.     Heart sounds: Normal heart sounds Pulmonary:     Effort: Pulmonary effort is normal. No respiratory distress.     Breath sounds:  Normal breath sounds.  Abdominal:     Abdomen is soft. There is no abdominal tenderness. Musculoskeletal:        General: No swelling. Normal range of motion.     Cervical back: Normal range of motion and neck supple. No neck tenderness. Negative spurling Right upper extremity: No obvious deformity, ecchymosis, erythema, or open wounds.  He has tenderness to palpation to the thenar eminence and the hypothenar eminence.  Normal grip strength.  Normal intrinsic muscle function of the hand.  Normal radial and ulnar pulses.  Positive Tinel's sign at the wrist.  Compartments are soft and compressible throughout forearm and upper arm.  Normal range of motion of shoulder, elbow, wrist.  Normal capillary refill. Skin:    General: Skin is warm and dry.     Capillary Refill: Capillary refill takes less than 2 seconds.     Findings:  No rash.  Neurological:     Mental Status: The patient is awake and alert.      ED Results / Procedures / Treatments   Labs (all labs ordered are listed, but only abnormal results are displayed) Labs Reviewed - No data to display   EKG     RADIOLOGY I independently reviewed and interpreted imaging and agree with radiologists findings.     PROCEDURES:  Critical Care performed:   Procedures   MEDICATIONS ORDERED IN ED: Medications - No data to display   IMPRESSION / MDM / [place] / ED COURSE  I reviewed the triage vital signs and the nursing notes.   Differential diagnosis includes, but is not limited to, fracture, contusion, carpal tunnel syndrome, hematoma.  Patient is awake and alert, hemodynamically stable and neurovascularly intact.  X-ray obtained at triage is negative for acute bony injury.  He has tenderness to palpation to his thenar and hypothenar eminence consistent with contusion given negative x-ray.  He also has paresthesias in his fingers with Tinel's sign, consistent with a possible concurrent carpal tunnel syndrome.  We  discussed rest, ice, elevation, Tylenol/Motrin, and he was given a wrist splint to keep his wrist in extension as he reports that he curls his wrists when he sleeps.  We discussed return precautions and outpatient follow-up.  Patient or stands and agrees with plan.  Discharged in stable condition.   Patient's presentation is most consistent with acute complicated illness / injury requiring diagnostic workup.    FINAL CLINICAL IMPRESSION(S) / ED DIAGNOSES   Final diagnoses:  Contusion of right hand, initial encounter     Rx / DC Orders   ED Discharge Orders     None        Note:  This document was prepared using Dragon voice recognition software and may include unintentional dictation errors.   Emeline Gins 09/29/22 1857    Blake Divine, MD 09/29/22 1859

## 2023-12-27 ENCOUNTER — Emergency Department
Admission: EM | Admit: 2023-12-27 | Discharge: 2023-12-27 | Disposition: A | Discharge status: Home / Self Care | Payer: Self-pay | Attending: Emergency Medicine | Admitting: Emergency Medicine

## 2023-12-27 ENCOUNTER — Other Ambulatory Visit: Payer: Self-pay

## 2023-12-27 DIAGNOSIS — G8929 Other chronic pain: Secondary | ICD-10-CM | POA: Insufficient documentation

## 2023-12-27 DIAGNOSIS — R202 Paresthesia of skin: Secondary | ICD-10-CM | POA: Insufficient documentation

## 2023-12-27 DIAGNOSIS — M79641 Pain in right hand: Secondary | ICD-10-CM | POA: Insufficient documentation

## 2023-12-27 NOTE — ED Triage Notes (Signed)
Pt reports back in 2023 he hurt his right hand while doing some yard work. Pt reports he has had pain ever since to right middle finger. Pt talks in complete sentences no distress noted

## 2023-12-27 NOTE — ED Provider Notes (Signed)
[facility] Provider Note    Event Date/Time   First MD Initiated Contact with Patient 12/27/23 1157     (approximate)   History   Hand Pain   HPI  Ryan Curtis is a 43 y.o. male with no significant past medical history presents emergency department states that he had a injury to the right hand in 2023.  States he gets numbness and tingling occasionally and it was worse today.  Does not know if his fingernail needs to be taken off as this is the area of concern.  No new injuries.  No fever or chills      Physical Exam   Triage Vital Signs: ED Triage Vitals  Encounter Vitals Group     BP 12/27/23 1156 (!) 183/83     Systolic BP Percentile --      Diastolic BP Percentile --      Pulse Rate 12/27/23 1156 78     Resp 12/27/23 1156 16     Temp 12/27/23 1156 98.6 F (37 C)     Temp Source 12/27/23 1156 Oral     SpO2 12/27/23 1156 93 %     Weight 12/27/23 1157 165 lb (74.8 kg)     Height 12/27/23 1157 5\' 10"  (1.778 m)     Head Circumference --      Peak Flow --      Pain Score 12/27/23 1157 10     Pain Loc --      Pain Education --      Exclude from Growth Chart --     Most recent vital signs: Vitals:   12/27/23 1156  BP: (!) 183/83  Pulse: 78  Resp: 16  Temp: 98.6 F (37 C)  SpO2: 93%     General: Awake, no distress.   CV:  Good peripheral perfusion. regular rate and  rhythm Resp:  Normal effort.  Abd:  No distention.   Other:  Right hand: Skin is intact, no redness pus or swelling, neurovascular is intact, full range of motion   ED Results / Procedures / Treatments   Labs (all labs ordered are listed, but only abnormal results are displayed) Labs Reviewed - No data to display   EKG     RADIOLOGY     PROCEDURES:   Procedures Chief Complaint  Patient presents with   Hand Pain      MEDICATIONS ORDERED IN ED: Medications - No data to display   IMPRESSION / MDM / ASSESSMENT AND PLAN / ED COURSE  I  reviewed the triage vital signs and the nursing notes.                              Differential diagnosis includes, but is not limited to, carpal tunnel, radiculopathy, nerve injury  Patient's presentation is most consistent with acute, uncomplicated illness.   I explained to the patient this is a chronic thing in the emergency room really cannot do anything for him.  He should follow-up with a hand specialist at this time.  Patient is in agreement treatment plan.  He was discharged stable condition.      FINAL CLINICAL IMPRESSION(S) / ED DIAGNOSES   Final diagnoses:  Chronic pain of right hand     Rx / DC Orders   ED Discharge Orders     None        Note:  This document was prepared using Dragon  voice recognition software and may include unintentional dictation errors.    Faythe Ghee, PA-C 12/27/23 1524    Janith Lima, MD 12/27/23 [phone]

## 2024-05-03 ENCOUNTER — Emergency Department
Admission: EM | Admit: 2024-05-03 | Discharge: 2024-05-03 | Disposition: A | Discharge status: Home / Self Care | Payer: Self-pay | Attending: Emergency Medicine | Admitting: Emergency Medicine

## 2024-05-03 ENCOUNTER — Other Ambulatory Visit: Payer: Self-pay

## 2024-05-03 DIAGNOSIS — L02215 Cutaneous abscess of perineum: Secondary | ICD-10-CM | POA: Insufficient documentation

## 2024-05-03 MED ORDER — LIDOCAINE HCL (PF) 1 % IJ SOLN
10.0000 mL | Freq: Once | INTRAMUSCULAR | Status: AC
  Administered 2024-05-03: 10 mL
  Filled 2024-05-03: qty 10

## 2024-05-03 MED ORDER — SULFAMETHOXAZOLE-TRIMETHOPRIM 800-160 MG PO TABS
1.0000 | ORAL_TABLET | Freq: Once | ORAL | Status: AC
  Administered 2024-05-03: 1 via ORAL
  Filled 2024-05-03: qty 1

## 2024-05-03 MED ORDER — DOXYCYCLINE HYCLATE 100 MG PO TABS: 100.0000 mg | ORAL_TABLET | Freq: Two times a day (BID) | ORAL | 0 refills | Status: AC

## 2024-05-03 MED ORDER — LIDOCAINE-EPINEPHRINE-TETRACAINE (LET) TOPICAL GEL
3.0000 mL | Freq: Once | TOPICAL | Status: AC
  Administered 2024-05-03: 3 mL via TOPICAL
  Filled 2024-05-03: qty 3

## 2024-05-03 NOTE — ED Provider Notes (Signed)
 [place] [facility]GENCY DEPARTMENT AT [facility] Provider Note   CSN: [id number] Arrival date & time: 05/03/24  0913     History  Chief Complaint  Patient presents with   Abscess    Ryan Curtis is a 43 y.o. male.  Patient here for abscess.  He notes a left scrotal abscess for about 4 days.  No fevers but has had some chills no elevated temperature.  No history of similar.  Denying history of diabetes.  No injury.   Abscess Associated symptoms: no fever, no nausea and no vomiting        Home Medications Prior to Admission medications   Medication Sig Start Date End Date Taking? Authorizing Provider  doxycycline (VIBRA-TABS) 100 MG tablet Take 1 tablet (100 mg total) by mouth 2 (two) times daily for 10 days. 05/03/24 05/13/24 Yes Jayson Waterhouse, Kaaren Ora, PA-C  fluticasone  (FLONASE ) 50 MCG/ACT nasal spray Place 1 spray into both nostrils 2 (two) times daily. 06/20/18   Cuthriell, Ardath Bears, PA-C  ibuprofen  (ADVIL ,MOTRIN ) 600 MG tablet Take 1 tablet (600 mg total) by mouth every 8 (eight) hours as needed. 10/22/16   Stafford Eagles, PA-C  magic mouthwash w/lidocaine  SOLN Take 5 mLs by mouth 4 (four) times daily. 06/20/18   Cuthriell, Ardath Bears, PA-C  ondansetron  (ZOFRAN -ODT) 4 MG disintegrating tablet Take 1 tablet (4 mg total) by mouth every 8 (eight) hours as needed for nausea or vomiting. 06/20/18   Cuthriell, Ardath Bears, PA-C      Allergies    Penicillins    Review of Systems   Review of Systems  Constitutional:  Negative for chills and fever.  HENT:  Negative for congestion.   Respiratory:  Negative for cough and shortness of breath.   Cardiovascular:  Negative for chest pain.  Gastrointestinal:  Negative for abdominal pain, diarrhea, nausea and vomiting.  Genitourinary:  Negative for dysuria.  Skin:  Positive for rash.  Neurological:  Negative for weakness and numbness.    Physical Exam Updated Vital Signs BP (!) 146/91 (BP Location: Left Arm)   Pulse (!) 102   Temp  97.9 F (36.6 C) (Oral)   Resp 18   Ht 5\' 10"  (1.778 m)   Wt 81.6 kg   SpO2 100%   BMI 25.83 kg/m  Physical Exam Vitals reviewed.  Constitutional:      Appearance: Normal appearance.  HENT:     Head: Normocephalic and atraumatic.     Nose: Nose normal.  Cardiovascular:     Pulses: Normal pulses.  Pulmonary:     Effort: Pulmonary effort is normal.  Musculoskeletal:     Cervical back: Normal range of motion.  Skin:    Comments: 1 cm left perineal abscess just posterior to scrotum.  No actual scrotal involvement or testicular swelling.  Neurological:     Mental Status: He is alert and oriented to person, place, and time. Mental status is at baseline.  Psychiatric:        Mood and Affect: Mood normal.        Behavior: Behavior normal.     ED Results / Procedures / Treatments   Labs (all labs ordered are listed, but only abnormal results are displayed) Labs Reviewed - No data to display  EKG None  Radiology No results found.  Procedures .Incision and Drainage  Date/Time: 05/03/2024 10:26 AM  Performed by: Hollie Luria, PA-C Authorized by: Hollie Luria, PA-C   Consent:    Consent obtained:  Verbal   Consent given  by:  Patient   Risks, benefits, and alternatives were discussed: yes     Risks discussed:  Bleeding, incomplete drainage and damage to other organs   Alternatives discussed:  No treatment Universal protocol:    Procedure explained and questions answered to patient or proxy's satisfaction: yes     Site/side marked: yes     Immediately prior to procedure, a time out was called: yes     Patient identity confirmed:  Verbally with patient Location:    Type:  Abscess   Size:  0.5   Location:  Anogenital   Anogenital location:  Perineum Pre-procedure details:    Skin preparation:  Povidone-iodine Sedation:    Sedation type:  None Anesthesia:    Anesthesia method:  Topical application and local infiltration   Topical anesthetic:  LET   Local anesthetic:   Lidocaine  1% w/o epi Procedure type:    Complexity:  Simple Procedure details:    Ultrasound guidance: no     Needle aspiration: no     Incision types:  Single straight   Incision depth:  Subcutaneous   Wound management:  Probed and deloculated   Drainage:  Purulent   Drainage amount:  Copious   Wound treatment:  Wound left open   Packing materials:  None Post-procedure details:    Procedure completion:  Tolerated     Medications Ordered in ED Medications  lidocaine -EPINEPHrine-tetracaine (LET) topical gel (3 mLs Topical Given 05/03/24 1000)  lidocaine  (PF) (XYLOCAINE ) 1 % injection 10 mL (10 mLs Other Given by Other 05/03/24 1016)  sulfamethoxazole-trimethoprim (BACTRIM DS) 800-160 MG per tablet 1 tablet (1 tablet Oral Given 05/03/24 1000)    ED Course/ Medical Decision Making/ A&P                                 Medical Decision Making Patient here for perineal abscess left-sided he is afebrile no significant tachycardia.  No history of diabetes.  Symptoms for about 4 days.  He will need incision and drainage based on exam.  It does not look like it is involving the scrotum itself thus he will be appropriate for a small incision and drainage here in the emergency department started on antibiotics will use Bactrim.  Discussed with attending Dr. Melven Stable who recommends Doxy he will be sent out on doxycycline.  Did tolerate incision and drainage well.  Follow-up with primary care.  Given wound aftercare instructions.    Risk Prescription drug management.           Final Clinical Impression(s) / ED Diagnoses Final diagnoses:  Perineal abscess    Rx / DC Orders ED Discharge Orders          Ordered    doxycycline (VIBRA-TABS) 100 MG tablet  2 times daily        05/03/24 1010              Hollie Luria, PA-C 05/03/24 1027    Viviano Ground, MD 05/03/24 1519

## 2024-05-03 NOTE — ED Triage Notes (Signed)
 Pt states abscess to scrotum that stared a few days ago. Pt denies fevers. Pt unsure of "fevers".

## 2024-05-03 NOTE — Discharge Instructions (Signed)
 Please wash with soap and warm water.  Try to express fluid when possible.  Take antibiotics as directed.  Follow-up with primary.
